# Patient Record
Sex: Female | Born: 2000 | Hispanic: No | Marital: Single | Smoking: Never smoker
Health system: Southern US, Community
[De-identification: ages and names within clinical notes are randomized; demographics above are authoritative.]

## PROBLEM LIST (undated history)

## (undated) DIAGNOSIS — G43909 Migraine, unspecified, not intractable, without status migrainosus: Secondary | ICD-10-CM

## (undated) DIAGNOSIS — J45909 Unspecified asthma, uncomplicated: Secondary | ICD-10-CM

## (undated) HISTORY — DX: Unspecified asthma, uncomplicated: J45.909

## (undated) HISTORY — DX: Migraine, unspecified, not intractable, without status migrainosus: G43.909

---
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
## 2021-08-20 ENCOUNTER — Emergency Department
Admission: EM | Admit: 2021-08-20 | Discharge: 2021-08-20 | Disposition: A | Discharge status: Home / Self Care | Payer: Medicaid - Out of State | Attending: Surgery | Admitting: Surgery

## 2021-08-20 DIAGNOSIS — Z20822 Contact with and (suspected) exposure to covid-19: Secondary | ICD-10-CM | POA: Insufficient documentation

## 2021-08-20 DIAGNOSIS — J020 Streptococcal pharyngitis: Secondary | ICD-10-CM | POA: Insufficient documentation

## 2021-08-20 LAB — VH STREP A RAPID TEST: Strep A, Rapid: POSITIVE — AB

## 2021-08-20 LAB — VH XPERT XPRESS © COV-2/FLU/RSV PLUS
Date of Onset: 20221128
Does patient reside in a congregate care setting?: NEGATIVE
Influenza A RNA: NEGATIVE
Influenza B RNA: NEGATIVE
Is patient employed in a healthcare setting?: NEGATIVE
RSV RNA: NEGATIVE
SARS-CoV-2 RNA: NEGATIVE

## 2021-08-20 MED ORDER — AMOXICILLIN-POT CLAVULANATE 875-125 MG PO TABS
1.00 | ORAL_TABLET | Freq: Two times a day (BID) | ORAL | 0 refills | Status: AC
Start: 2021-08-20 — End: 2021-08-30

## 2021-08-20 MED ORDER — AMOXICILLIN-POT CLAVULANATE 875-125 MG PO TABS
ORAL_TABLET | ORAL | Status: AC
Start: 2021-08-20 — End: ?
  Filled 2021-08-20: qty 1

## 2021-08-20 MED ORDER — AMOXICILLIN-POT CLAVULANATE 875-125 MG PO TABS
875.00 mg | ORAL_TABLET | Freq: Once | ORAL | Status: AC
Start: 2021-08-20 — End: 2021-08-20
  Administered 2021-08-20: 22:00:00 1 via ORAL

## 2021-08-20 NOTE — ED Triage Notes (Signed)
Sore throat for the last 2 days. Patient also c/o congested cough.

## 2021-08-20 NOTE — ED Provider Notes (Signed)
EMERGENCY DEPARTMENT HISTORY AND PHYSICAL EXAM      Date Time: 08/20/21 10:13 PM  Patient Name: Brandy Harper  Attending Physician: Arbie Cookey, DO      Assessment/Plan:   IMPRESSION/DIFFERENTAL DX:   Final diagnoses:   Strep throat     PLAN:   ED Disposition       ED Disposition   Discharge    Condition   --    Date/Time   Thu Aug 20, 2021 10:04 PM    Comment   Laverda Harper [place] discharge to home/self care.    Condition at disposition: Stable    Follow up with Brandy Harper, next week. Call for the appointment.                   History of Presenting Illness:   Brandy Harper IS A 20 y.o. female who presents with a several day hx of having URI/sore throat symptoms.  She thought it was "allergies" and didn't worry about it much.  She was worried about the "flu" so she wanted to get checked. She had covid and RSV previously.    Past Medical History:     Past Medical History:   Diagnosis Date    Asthma     Migraines        Past Surgical History:   History reviewed. No pertinent surgical history.    Family History:   History reviewed. No pertinent family history.    Social History:     Social History     Socioeconomic History    Marital status: Single     Spouse name: Not on file    Number of children: Not on file    Years of education: Not on file    Highest education level: Not on file   Occupational History    Not on file   Tobacco Use    Smoking status: Never    Smokeless tobacco: Never   Vaping Use    Vaping Use: Never used   Substance and Sexual Activity    Alcohol use: No    Drug use: No    Sexual activity: Not on file   Other Topics Concern    Not on file   Social History Narrative    Not on file     Social Determinants of Health     Financial Resource Strain: Not on file   Food Insecurity: Not on file   Transportation Needs: Not on file   Physical Activity: Not on file   Stress: Not on file   Social Connections: Not on file   Intimate Partner Violence: Not on file   Housing Stability:  Not on file       Allergies:     Allergies   Allergen Reactions    Latex Rash       Medications:     Previous Medications    ALBUTEROL (PROVENTIL HFA;VENTOLIN HFA) 108 (90 BASE) MCG/ACT INHALER    Inhale 2 puffs into the lungs every 4 (four) hours as needed        ALBUTEROL (PROVENTIL) (2.5 MG/3ML) 0.083% NEBULIZER SOLUTION    Take 2.5 mg by nebulization every 6 (six) hours as needed for Wheezing    DIPHENHYDRAMINE (BENADRYL) 25 MG TABLET    Take 1 tablet (25 mg total) by mouth every 6 (six) hours as needed for Itching or Allergies    FEXOFENADINE (ALLEGRA) 60 MG TABLET    Take 60 mg by mouth  2 (two) times daily    FLUTICASONE (FLOVENT HFA) 110 MCG/ACT INHALER    Inhale 1 puff into the lungs 2 (two) times daily        Review of Systems:   Constitutional: No fever or chills.  GI: No vomiting or diarrhea.  ENT: No ear pain.  + sore throat  Cardiovascular: No chest pain or palpitations.  Respiratory: No cough or shortness of breath.  Skin:No rash or skin lesions.  All other systems reviewed and negative except as above, pertinent findings in HPI.      Physical Exam:   Constitutional:  Vitals signs reviewed, well-appearing  Eyes:  Normal to inspection, no discharge  Neuro:  GCS 15, no focal motor deficits  Skin:  Warm, dry  Head:  Atraumatic, normocephalic  Resp/Chest:  Breath sounds normal, no distress  Cardio:  s1s2, rrr, no murmur,   Abdomen:  Soft, no peritioneal signs, non-tender  Upper Extremity:  Inspection normal, no cyanosis  Lower Extremity:  Inspection normal, no cyanosis  HEENT: + slightly red posterior pharynx, no exudates. No vesicles.     Labs / Rads:     Results       Procedure Component Value Units Date/Time    Respiratory Specimen Xpert Xpress  CoV-2 / Flu / RSV Plus [[id number]] Collected: 08/20/21 2018    Specimen: Nasopharyngeal Swab Updated: 08/20/21 2110     Influenza A RNA Negative     Influenza B RNA Negative     RSV RNA Negative     SARS-CoV-2 RNA Negative     Does patient have symptoms related  to condition of interest? Y     Date of Onset [id number]     Is patient employed in a healthcare setting? N     Does patient reside in a congregate care setting? N     Is the patient pregnant? UNK    Narrative:      Specimen source - Nasopharyngeal Swab     Influenza A tests are unable to distinguish between novel and seasonal influenza A.     A negative result for either Influenza A or B does not exclude influenza virus infection. Clinical correlation required.     All positive influenza tests (A or B) require placement of patient on droplet precaution isolation.    Strep A Rapid Test [[id number]]  (Abnormal) Collected: 08/20/21 2018    Specimen: Throat Updated: 08/20/21 2058     Strep A, Rapid **Positive**          No results found.      Labs and Radiological Studies Reviewed      Arbie Cookey, DO                           Brandy Harper, [place]  08/20/21 2213

## 2021-09-08 ENCOUNTER — Emergency Department
Admission: EM | Admit: 2021-09-08 | Discharge: 2021-09-08 | Disposition: A | Discharge status: Home / Self Care | Payer: Medicaid - Out of State | Attending: Emergency Medicine | Admitting: Emergency Medicine

## 2021-09-08 DIAGNOSIS — Z79899 Other long term (current) drug therapy: Secondary | ICD-10-CM | POA: Insufficient documentation

## 2021-09-08 DIAGNOSIS — R102 Pelvic and perineal pain: Secondary | ICD-10-CM | POA: Insufficient documentation

## 2021-09-08 DIAGNOSIS — N946 Dysmenorrhea, unspecified: Secondary | ICD-10-CM | POA: Insufficient documentation

## 2021-09-08 DIAGNOSIS — N939 Abnormal uterine and vaginal bleeding, unspecified: Secondary | ICD-10-CM | POA: Insufficient documentation

## 2021-09-08 LAB — COMPREHENSIVE METABOLIC PANEL
ALT: 13 U/L (ref 0–55)
AST (SGOT): 12 U/L (ref 10–42)
Albumin/Globulin Ratio: 1.34 Ratio (ref 0.80–2.00)
Albumin: 4.3 gm/dL (ref 3.5–5.0)
Alkaline Phosphatase: 50 U/L (ref 40–145)
Anion Gap: 13.5 mMol/L (ref 7.0–18.0)
BUN / Creatinine Ratio: 18.8 Ratio (ref 10.0–30.0)
BUN: 16 mg/dL (ref 7–22)
Bilirubin, Total: 1.5 mg/dL — ABNORMAL HIGH (ref 0.1–1.2)
CO2: 25 mMol/L (ref 20–30)
Calcium: 9.8 mg/dL (ref 8.5–10.5)
Chloride: 106 mMol/L (ref 98–110)
Creatinine: 0.85 mg/dL (ref 0.60–1.20)
EGFR: 101 mL/min/{1.73_m2} (ref 60–150)
Globulin: 3.2 gm/dL (ref 2.0–4.0)
Glucose: 100 mg/dL — ABNORMAL HIGH (ref 71–99)
Osmolality Calculated: 283 mOsm/kg (ref 275–300)
Potassium: 3.5 mMol/L (ref 3.5–5.3)
Protein, Total: 7.5 gm/dL (ref 6.0–8.3)
Sodium: 141 mMol/L (ref 136–147)

## 2021-09-08 LAB — CBC AND DIFFERENTIAL
Basophils %: 1.2 % (ref 0.0–3.0)
Basophils Absolute: 0.1 10*3/uL (ref 0.0–0.3)
Eosinophils %: 9.5 % — ABNORMAL HIGH (ref 0.0–7.0)
Eosinophils Absolute: 0.7 10*3/uL (ref 0.0–0.8)
Hematocrit: 44.7 % (ref 36.0–48.0)
Hemoglobin: 14.7 gm/dL (ref 12.0–16.0)
Lymphocytes Absolute: 2.6 10*3/uL (ref 0.6–5.1)
Lymphocytes: 33.9 % (ref 15.0–46.0)
MCH: 31 pg (ref 28–35)
MCHC: 33 gm/dL (ref 31–36)
MCV: 95 fL (ref 80–100)
MPV: 6.8 fL (ref 6.0–10.0)
Monocytes Absolute: 0.4 10*3/uL (ref 0.1–1.7)
Monocytes: 4.5 % (ref 3.0–15.0)
Neutrophils %: 50.8 % (ref 42.0–78.0)
Neutrophils Absolute: 4 10*3/uL (ref 1.7–8.6)
PLT CT: 237 10*3/uL (ref 130–440)
RBC: 4.7 10*6/uL (ref 3.80–5.00)
RDW: 11 % (ref 10.5–14.5)
WBC: 7.8 10*3/uL (ref 4.0–11.0)

## 2021-09-08 LAB — VH URINALYSIS WITH MICROSCOPIC AND CULTURE IF INDICATED
Bilirubin, UA: NEGATIVE mg/dL
Glucose, UA: NEGATIVE mg/dL
Ketones UA: NEGATIVE mg/dL
Nitrite, UA: NEGATIVE
Protein, UR: NEGATIVE mg/dL
Urine Specific Gravity: 1.03 (ref 1.001–1.040)
Urobilinogen, UA: 0.2 mg/dL — AB
pH, Urine: 5 pH (ref 5.0–8.0)

## 2021-09-08 LAB — URINE HCG QUALITATIVE: Urine HCG Qualitative: NEGATIVE

## 2021-09-08 MED ORDER — VH SODIUM CHLORIDE 0.9 % IV BOLUS
1000.00 mL | Freq: Once | INTRAVENOUS | Status: AC
Start: 2021-09-08 — End: 2021-09-08
  Administered 2021-09-08: 06:00:00 1000 mL via INTRAVENOUS

## 2021-09-08 MED ORDER — KETOROLAC TROMETHAMINE 30 MG/ML IJ SOLN
30.00 mg | Freq: Once | INTRAMUSCULAR | Status: AC
Start: 2021-09-08 — End: 2021-09-08
  Administered 2021-09-08: 06:00:00 30 mg via INTRAVENOUS

## 2021-09-08 MED ORDER — IBUPROFEN 600 MG PO TABS
600.0000 mg | ORAL_TABLET | Freq: Three times a day (TID) | ORAL | 0 refills | Status: AC | PRN
Start: 2021-09-08 — End: ?

## 2021-09-08 MED ORDER — KETOROLAC TROMETHAMINE 30 MG/ML IJ SOLN
INTRAMUSCULAR | Status: AC
Start: 2021-09-08 — End: ?
  Filled 2021-09-08: qty 1

## 2021-09-08 NOTE — ED Triage Notes (Signed)
Patient has severe menstrual pain that comes in waves.

## 2021-09-08 NOTE — Discharge Instructions (Signed)
Follow-up with your primary care doctor as well as with an OB/GYN.  Please return at anytime if you have any problems or concerns.

## 2021-09-08 NOTE — ED Provider Notes (Signed)
History     Chief Complaint   Patient presents with    Menstrual Problem     This is a 20 year old female, presents with chief complaint of lower pelvic pain, since the onset of her menses this morning.  She states she knew that she was going to beginning her menstrual cycle, and it is typically quite bad with severe cramping and heavy bleeding.  She has not discussed this with an OB/GYN but states that her mom and her aunt have similar symptoms when they were her age.  She has not taken anything prior to arrival but often when she does have her period she has to take 2 extra strength Tylenol's every 4-6 hours to an inch the pain.  She is not on any oral contraceptives, she has no concerns for sexually transmitted infections or pregnancy.    The history is provided by the patient.      Past Medical History:   Diagnosis Date    Asthma     Migraines        History reviewed. No pertinent surgical history.    History reviewed. No pertinent family history.    Social  Social History     Tobacco Use    Smoking status: Never    Smokeless tobacco: Never   Vaping Use    Vaping Use: Never used   Substance Use Topics    Alcohol use: No    Drug use: No       .     Allergies   Allergen Reactions    Latex Rash       Home Medications               albuterol (PROVENTIL HFA;VENTOLIN HFA) 108 (90 Base) MCG/ACT inhaler     Inhale 2 puffs into the lungs every 4 (four) hours as needed         albuterol (PROVENTIL) (2.5 MG/3ML) 0.083% nebulizer solution     Take 2.5 mg by nebulization every 6 (six) hours as needed for Wheezing     diphenhydrAMINE (BENADRYL) 25 MG tablet     Take 1 tablet (25 mg total) by mouth every 6 (six) hours as needed for Itching or Allergies     fexofenadine (ALLEGRA) 60 MG tablet     Take 60 mg by mouth 2 (two) times daily     fluticasone (FLOVENT HFA) 110 MCG/ACT inhaler     Inhale 1 puff into the lungs 2 (two) times daily             Review of Systems   Constitutional: Negative.    HENT: Negative.      Respiratory: Negative.     Cardiovascular: Negative.    Gastrointestinal:  Positive for abdominal pain. Negative for constipation, diarrhea, nausea and vomiting.   Endocrine: Negative.    Genitourinary:  Positive for menstrual problem, pelvic pain and vaginal bleeding. Negative for dysuria, flank pain, hematuria and vaginal discharge.   Musculoskeletal: Negative.    Skin: Negative.    Allergic/Immunologic: Negative.    Neurological: Negative.    Hematological: Negative.    Psychiatric/Behavioral: Negative.       Physical Exam    BP: 140/82, Heart Rate: 105, Temp: 97.7 F (36.5 C), Resp Rate: 20, SpO2: 100 %, Weight: 58.4 kg    Physical Exam  Vitals and nursing note reviewed.   Constitutional:       General: She is not in acute distress.     Appearance: Normal appearance.  She is well-developed and normal weight. She is not ill-appearing, toxic-appearing or diaphoretic.      Comments: Appears in pain, nontoxic.   HENT:      Head: Normocephalic and atraumatic.   Eyes:      General: No scleral icterus.     Conjunctiva/sclera: Conjunctivae normal.      Pupils: Pupils are equal, round, and reactive to light.   Neck:      Vascular: No JVD.      Trachea: Trachea normal.   Cardiovascular:      Rate and Rhythm: Normal rate and regular rhythm.      Pulses: Normal pulses.      Heart sounds: Normal heart sounds, S1 normal and S2 normal. No murmur heard.  Pulmonary:      Effort: Pulmonary effort is normal.      Breath sounds: Normal breath sounds. No stridor.   Abdominal:      General: Bowel sounds are normal.      Palpations: Abdomen is soft.      Tenderness: There is no abdominal tenderness.   Musculoskeletal:         General: Normal range of motion.      Cervical back: Full passive range of motion without pain, normal range of motion and neck supple.      Right lower leg: No edema.      Left lower leg: No edema.   Lymphadenopathy:      Cervical: No cervical adenopathy.   Skin:     General: Skin is warm and dry.       Capillary Refill: Capillary refill takes less than 2 seconds.      Comments: Undressed where appropriate   Neurological:      General: No focal deficit present.      Mental Status: She is alert and oriented to person, place, and time.      GCS: GCS eye subscore is 4. GCS verbal subscore is 5. GCS motor subscore is 6.      Comments: speech fluent, gait normal.   Psychiatric:         Mood and Affect: Mood normal.         Speech: Speech normal.         Behavior: Behavior normal.         MDM and ED Course     ED Medication Orders (From admission, onward)      Start Ordered     Status Ordering Provider    09/08/21 [id number] 09/08/21 0607  sodium chloride 0.9 % bolus 1,000 mL  Once in ED        Route: Intravenous  Ordered Dose: 1,000 mL     Last MAR action: Stopped Norvin Ohlin C    09/08/21 6213 09/08/21 0865  ketorolac (TORADOL) injection 30 mg  Once in ED        Route: Intravenous  Ordered Dose: 30 mg     Last MAR action: Given Janequa Kipnis C           Labs Reviewed   CBC AND DIFFERENTIAL - Abnormal; Notable for the following components:       Result Value    Eosinophils % 9.5 (*)     All other components within normal limits   COMPREHENSIVE METABOLIC PANEL - Abnormal; Notable for the following components:    Glucose 100 (*)     Bilirubin, Total 1.5 (*)     All other components within normal limits  VH URINALYSIS WITH MICROSCOPIC AND CULTURE IF INDICATED       - Abnormal; Notable for the following components:    Clarity, UA Cloudy (*)     Blood, UA Large (*)     Urobilinogen, UA 0.2 (*)     Leukocyte Esterase, UA Trace (*)     RBC, UA 20-30 (*)     Bacteria, UA Occasional (*)     All other components within normal limits    Narrative:     A Urine Culture has been ordered based upon the Positive UA results.   VH CULTURE, URINE   URINE HCG QUALITATIVE         MDM  Number of Diagnoses or Management Options  Dysmenorrhea  Diagnosis management comments: This is a 20 year old female with dysmenorrhea, has not take anything  prior to arrival, will start IV, check CBC and provide IV fluids and Toradol for pain control.       Amount and/or Complexity of Data Reviewed  Clinical lab tests: ordered and reviewed               652p she states she is completely pain-free now, we discussed supportive care as well as suppressive OCPs after she is seen by OB/GYN, or the use of ibuprofen before the onset of menses and hydration for supportive care.  Pt Frederic home with strict return precautions and the importance of follow up. She is discharged home in stable condition.      Procedures    Clinical Impression & Disposition     Clinical Impression  Final diagnoses:   Dysmenorrhea        ED Disposition       ED Disposition   Discharge    Condition   --    Date/Time   Tue Sep 08, 2021  6:59 AM    Comment   Kylene L Sirico discharge to home/self care.    Condition at disposition: Stable                  Discharge Medication List as of 09/08/2021  7:01 AM        START taking these medications    Details   ibuprofen (ADVIL) 600 MG tablet Take 1 tablet (600 mg) by mouth every 8 (eight) hours as needed for Pain Take with food, Starting Tue 09/08/2021, Print                         Judi Cong, MD  09/08/21 [phone]

## 2021-09-10 LAB — VH CULTURE, URINE

## 2021-12-06 ENCOUNTER — Emergency Department
Admission: EM | Admit: 2021-12-06 | Discharge: 2021-12-06 | Disposition: A | Discharge status: Home / Self Care | Payer: Medicaid HMO | Attending: Emergency Medicine | Admitting: Emergency Medicine

## 2021-12-06 DIAGNOSIS — R5383 Other fatigue: Secondary | ICD-10-CM | POA: Insufficient documentation

## 2021-12-06 DIAGNOSIS — Z8709 Personal history of other diseases of the respiratory system: Secondary | ICD-10-CM | POA: Insufficient documentation

## 2021-12-06 DIAGNOSIS — J069 Acute upper respiratory infection, unspecified: Secondary | ICD-10-CM | POA: Insufficient documentation

## 2021-12-06 DIAGNOSIS — R0602 Shortness of breath: Secondary | ICD-10-CM | POA: Insufficient documentation

## 2021-12-06 DIAGNOSIS — J029 Acute pharyngitis, unspecified: Secondary | ICD-10-CM | POA: Insufficient documentation

## 2021-12-06 DIAGNOSIS — Z20822 Contact with and (suspected) exposure to covid-19: Secondary | ICD-10-CM | POA: Insufficient documentation

## 2021-12-06 DIAGNOSIS — R509 Fever, unspecified: Secondary | ICD-10-CM | POA: Insufficient documentation

## 2021-12-06 DIAGNOSIS — R059 Cough, unspecified: Secondary | ICD-10-CM | POA: Insufficient documentation

## 2021-12-06 LAB — VH XPERT XPRESS © COV-2/FLU/RSV PLUS
Date of Onset: 20230312
Does patient reside in a congregate care setting?: NEGATIVE
Influenza A RNA: NEGATIVE
Influenza B RNA: NEGATIVE
Is patient employed in a healthcare setting?: NEGATIVE
RSV RNA: NEGATIVE
SARS-CoV-2 RNA: NEGATIVE

## 2021-12-06 MED ORDER — DEXAMETHASONE 4 MG PO TABS
8.0000 mg | ORAL_TABLET | Freq: Once | ORAL | Status: AC
Start: 2021-12-06 — End: 2021-12-06
  Administered 2021-12-06: 8 mg via ORAL

## 2021-12-06 MED ORDER — DEXAMETHASONE 4 MG PO TABS
ORAL_TABLET | ORAL | Status: AC
Start: 2021-12-06 — End: ?
  Filled 2021-12-06: qty 2

## 2021-12-06 NOTE — Discharge Instructions (Signed)
COVID flu and RSV tests are pending.  We will call you with any positive results.  You can also review them on MyChart.  Drink plenty of fluids.  Use your asthma medications as needed.  You were given a dose of steroids here that is long-acting.  Take Tylenol as needed for fevers or pain.

## 2021-12-06 NOTE — ED Provider Notes (Signed)
EMERGENCY DEPARTMENT  History and Physical Exam       Patient Name: Brandy Harper,Brandy Harper  Encounter Date:  12/06/2021  Attending Physician: Rosalyn GessMichael E. Grace IsaacWatts, M.D.  PCP: Oneita HurtPcp, None, MD  Patient DOB:  2001/07/05  MRN:  [id number]  Room:  EX8/EX8-A    Medical Decision Making     History exam most consistent with viral syndrome.  Patient looks well.  She has a fairly normal exam.  Does have a hoarse voice.  Sounds like she lost her voice about a week ago and has been getting slowly better.  She has a history of asthma and was worried she had a respiratory infection.    Lungs are clear.  Oropharynx is moist.  Again she does have a hoarse voice.  Abdomen is benign.  No cervical adenopathy.  No meningismus.  She does not have any wheezing.    I think this is viral.  I am going to swab her.  We will give her some steroids.  Her history of asthma she likely does have a little bit of a flareup even though she is not wheezing now.    Respiratory panel is negative.  Patient is feeling much better after drinking crackers.  She is comfortable going home.  She was given Decadron here.    In addition to the above history, please see nursing notes. Allergies, meds, past medical, family, social hx, and the results of the diagnostic studies performed have been reviewed by myself.         Diagnosis / Disposition     Clinical Impression  1. Acute URI    2. History of asthma        Disposition  ED Disposition       ED Disposition   Discharge    Condition   --    Date/Time   Sun Dec 06, 2021  3:20 PM    Comment   [name] discharge to home/self care.    Condition at disposition: Stable                   Follow up for Discharged Patients  No follow-up provider specified.    Prescriptions for Discharged Patients  New Prescriptions    No medications on file                   History of Presenting Illness     Chief complaint: Cough, Sore Throat, and Shortness of Breath    HPI/ROS is limited by: none  HPI/ROS given by: Patient    Brandy  Harper Harper is a 21 y.o. female who presents to the ED with complaint of 1 week of respiratory symptoms that include subjective fevers laryngitis loss of voice now hoarse voice no shortness of breath she has had some myalgias.  She was wheezing earlier and used her inhalers.  She is had a little bit of a cough.  She did feel short of breath.  Her inhalers help.  She felt earlier that she was presyncopal but did not pass out.  She has been drinking fluids.  Her mom encouraged her to come to the hospital.       Review of Systems   Review of Systems   Constitutional:  Positive for chills, fatigue and fever (Subjective).   HENT:  Positive for congestion. Negative for sore throat.    Respiratory:  Positive for cough, shortness of breath and wheezing.    Cardiovascular:  Negative for chest pain and  palpitations.   Gastrointestinal:  Negative for abdominal pain, diarrhea and vomiting.   Genitourinary: Negative.    Musculoskeletal:  Positive for myalgias.   Skin:  Negative for rash.        Other pertinent ROS findings in HPI.  Allergies & Medications     Pt is allergic to bee venom and latex.    Current/Home Medications    ALBUTEROL (PROVENTIL HFA;VENTOLIN HFA) 108 (90 BASE) MCG/ACT INHALER    Inhale 2 puffs into the lungs every 4 (four) hours as needed        ALBUTEROL (PROVENTIL) (2.5 MG/3ML) 0.083% NEBULIZER SOLUTION    Take 2.5 mg by nebulization every 6 (six) hours as needed for Wheezing    DIPHENHYDRAMINE (BENADRYL) 25 MG TABLET    Take 1 tablet (25 mg total) by mouth every 6 (six) hours as needed for Itching or Allergies    FEXOFENADINE (ALLEGRA) 60 MG TABLET    Take 60 mg by mouth 2 (two) times daily    FLUTICASONE (FLOVENT HFA) 110 MCG/ACT INHALER    Inhale 1 puff into the lungs 2 (two) times daily    IBUPROFEN (ADVIL) 600 MG TABLET    Take 1 tablet (600 mg) by mouth every 8 (eight) hours as needed for Pain Take with food        Past Medical History     Pt   Past Medical History:   Diagnosis Date    Asthma      Migraines         Past Surgical History     Pt History reviewed. No pertinent surgical history.     Family History     The family history is not on file.     Social History     Pt reports that she has never smoked. She has never used smokeless tobacco. She reports that she does not drink alcohol and does not use drugs.     Physical Exam     Blood pressure 130/78, pulse 112, temperature 99.8 F (37.7 C), temperature source Tympanic, resp. rate 18, height 1.702 m, weight 60.2 kg, last menstrual period 11/16/2021, SpO2 98 %.    Vitals signs reviewed.    Physical Exam  Vitals and nursing note reviewed.   Constitutional:       General: She is not in acute distress.     Appearance: She is well-developed.   HENT:      Head: Normocephalic.      Mouth/Throat:      Mouth: Mucous membranes are moist.      Pharynx: Uvula midline. No oropharyngeal exudate.      Comments: Voice is a little hoarse.  No stridor.  Eyes:      Extraocular Movements: Extraocular movements intact.      Conjunctiva/sclera: Conjunctivae normal.   Cardiovascular:      Rate and Rhythm: Normal rate and regular rhythm.      Pulses: Normal pulses.      Heart sounds: Normal heart sounds.   Pulmonary:      Effort: Pulmonary effort is normal.      Breath sounds: Normal breath sounds.   Abdominal:      Palpations: Abdomen is soft.      Tenderness: There is no abdominal tenderness.   Musculoskeletal:      Cervical back: Normal range of motion and neck supple.   Skin:     General: Skin is warm.   Neurological:      Mental Status:  She is alert and oriented to person, place, and time.   Psychiatric:         Behavior: Behavior normal.              Diagnostic Results     The results of the diagnostic studies have been reviewed by myself:    Radiologic Studies  No results found.         Consultation          Procedures     None        Note:  This chart was generated by the Epic EMR system/speech recognition and may contain inherent errors or omissions not intended by  the user. Grammatical errors, random word insertions, deletions, pronoun errors and incomplete sentences are occasional consequences of this technology due to software limitations. Not all errors are caught or corrected. If there are questions or concerns about the content of this note or information contained within the body of this dictation they should be addressed directly with the author for clarification        Tori Milks, MD  12/06/21 1540

## 2021-12-06 NOTE — ED Triage Notes (Addendum)
Voice hoarseness and sore throat for about a week. Reports that she felt that she had a fever today but did not check at home, states that she did take tylenol at 1230. Reports presyncopal episode at home., no LOC. Also feeling SOB, hx of asthma. Reports taking neb at home and it helped with SOB, but did not take completely away.

## 2022-04-06 ENCOUNTER — Emergency Department
Admission: EM | Admit: 2022-04-06 | Discharge: 2022-04-06 | Disposition: A | Discharge status: Home / Self Care | Payer: Medicaid HMO | Attending: Emergency Medicine | Admitting: Emergency Medicine

## 2022-04-06 DIAGNOSIS — W458XXA Other foreign body or object entering through skin, initial encounter: Secondary | ICD-10-CM | POA: Insufficient documentation

## 2022-04-06 DIAGNOSIS — S91332A Puncture wound without foreign body, left foot, initial encounter: Secondary | ICD-10-CM | POA: Insufficient documentation

## 2022-04-06 MED ORDER — CIPROFLOXACIN HCL 500 MG PO TABS
500.0000 mg | ORAL_TABLET | Freq: Two times a day (BID) | ORAL | 0 refills | Status: AC
Start: 2022-04-06 — End: 2022-04-13

## 2022-04-06 MED ORDER — IBUPROFEN 600 MG PO TABS
600.0000 mg | ORAL_TABLET | Freq: Four times a day (QID) | ORAL | 0 refills | Status: AC | PRN
Start: 2022-04-06 — End: ?

## 2022-04-06 NOTE — ED Provider Notes (Signed)
[facility]  Emergency Department       Patient Name: Brandy Harper, Brandy Harper Patient DOB:  Jan 23, 2001   Encounter Date:  04/06/2022 Age: 21 y.o. female   Attending ED Physician: Dorie Rank, MD MRN:  [id number]   Room:  EX2/EX2-A PCP: Pcp, None, MD      Diagnosis / Disposition:   Final Impression  1. Puncture wound of left foot, initial encounter        Disposition              ED Disposition       ED Disposition   Discharge    Condition   --    Date/Time   Tue Apr 06, 2022 12:44 PM    Comment   Reggie L Lemus discharge to home/self care.    Condition at disposition: Stable                 Follow up  Petra Kuba, DPM  [address]  [place] [address]  [phone]    Schedule an appointment as soon as possible for a visit in 2 days  For wound re-check      Prescriptions  New Prescriptions    CIPROFLOXACIN (CIPRO) 500 MG TABLET    Take 1 tablet (500 mg) by mouth 2 (two) times daily for 7 days    IBUPROFEN (ADVIL) 600 MG TABLET    Take 1 tablet (600 mg) by mouth every 6 (six) hours as needed for Pain or Fever         History of Presenting Illness:   Chief complaint: Puncture Wound    HPI/ROS is limited by: none  HPI/ROS given by: Patient    Location: L foot  Quality: Achy  Duration: 2 days  Severity: mild    Nursing Notes reviewed and acknowledged.    Brandy Harper is a 21 y.o. female who presents with single puncture wound to plantar aspect of L foot after stepping on a nail in a pig [place]. States she was wearing Crocs. Injury happened 2 days ago. States L foot was swollen but now improved. Able to bear weight and ambulate. No bleeding or drainage. Tetanus UTD. Denies other injuries or complaints.    In addition to the above history, please see nursing notes. Allergies, meds, past medical, family, social hx, and the results of the diagnostic studies performed have been reviewed by myself.      Review of Systems   HEENT:  No ear pain. No congestion.  No discharge. No sore throat.  No  difficulty swallowing.  Respiratory: No cough.  No shortness of breath.  Cardiovascular: No chest pain.  No palpitations.  GI: No Nausea, Vomiting, Diarrhea, or GI bleeding  GU:  No dysuria. No hematuria.   Neurological:  No headache.  No lateralizing weakness.  Musculoskeletal:  No unusual myalgias or weakness  Skin:  No rash.  +single puncture wound to plantar aspect of L foot  Endocrine:  No weight change, polyuria, or polydypsia  Psychiatric:  No depression.  No anxiety.  No SI/HI/AVH    All other systems reviewed and negative except as above, pertinent findings in HPI.        Allergies / Medications:   Pt is allergic to bee venom and latex.    Current/Home Medications    ALBUTEROL (PROVENTIL HFA;VENTOLIN HFA) 108 (90 BASE) MCG/ACT INHALER    Inhale 2 puffs into the lungs every 4 (four) hours as needed  ALBUTEROL (PROVENTIL) (2.5 MG/3ML) 0.083% NEBULIZER SOLUTION    Take 2.5 mg by nebulization every 6 (six) hours as needed for Wheezing    DIPHENHYDRAMINE (BENADRYL) 25 MG TABLET    Take 1 tablet (25 mg total) by mouth every 6 (six) hours as needed for Itching or Allergies    FEXOFENADINE (ALLEGRA) 60 MG TABLET    Take 60 mg by mouth 2 (two) times daily    FLUTICASONE (FLOVENT HFA) 110 MCG/ACT INHALER    Inhale 1 puff into the lungs 2 (two) times daily    IBUPROFEN (ADVIL) 600 MG TABLET    Take 1 tablet (600 mg) by mouth every 8 (eight) hours as needed for Pain Take with food         Past History:   Medical: Pt has a past medical history of Asthma and Migraines.    Surgical: Pt  has no past surgical history on file.    Family: The family history is not on file.    Social: Pt reports that she has never smoked. She has never used smokeless tobacco. She reports that she does not drink alcohol and does not use drugs.      Physical Exam:   Constitutional: Vital signs reviewed. Well appearing.  Head: Normocephalic, atraumatic  Eyes: PERRL. EOMI. Conjunctiva and sclera are normal.  No injection or discharge.  Ears,  Nose, Throat:  Normal external examination of the nose and ears.    Neck: Normal range of motion. Non-tender.   Respiratory/Chest: No respiratory distress.  Cardiac: regular rate and rhythm  Extremities: +single puncture wound to plantar aspect of L foot w/o surrounding erythema or induration or fluctuance. no edema, positive pulses with good capillary refill  Neurological: No focal motor or sensory deficits by observation. Speech normal. Normal gait.  Skin: Warm and dry. No rash.  Psychiatric: Alert and conversant.  Normal affect.  Normal insight.        MDM:   21 y.o. female presents with single puncture wound to plantar aspect of L foot after stepping on a nail in a pig pen. Tetanus UTD. No surrounding evidence of cellulitis or abscess. NV intact. Pt is ambulatory. No bony injuries or foreign body appreciated and pt declined XR. C/f puncture wound in L foot. Will [place] w rx for ciprofloxacin or pseudomonal coverage, ibuprofen and [place] w podiatry f/u for wound check.       Course in ED:         Diagnostic Results:   The results of the diagnostic studies have been reviewed by myself:    Radiologic Studies  No results found.    Lab Studies  Labs Reviewed - No data to display    EKG: none          Procedure / Critical Care time/EKG:   Total time providing critical care:none      ATTESTATIONS   This chart was generated by an EMR and may contain errors or additions/omissions not intended by the user.           Dorie Rank, M.D.                 Joellyn Quails, MD  04/06/22 1255

## 2022-04-06 NOTE — ED Triage Notes (Signed)
Patient c/o left foot pain and swelling after stepping on a nail in a pig pen.

## 2022-04-06 NOTE — Discharge Instructions (Addendum)
Your physical exam was notable for a single puncture wound of the sole of left foot.  There is no clinical evidence of infection at this time.  Your tetanus immunization is up-to-date.  Take ciprofloxacin as prescribed to prevent soft tissue infection.  Take ibuprofen or Tylenol as needed for pain.  Keep your wound clean and dry and wash with soap and warm water daily.  Follow-up with a podiatrist or primary care physician within 2 days for wound check.  Return to ER if you develop any concerning symptoms.

## 2022-04-23 ENCOUNTER — Emergency Department
Admission: EM | Admit: 2022-04-23 | Discharge: 2022-04-23 | Disposition: A | Discharge status: Home / Self Care | Payer: Medicaid HMO | Attending: Emergency Medicine | Admitting: Emergency Medicine

## 2022-04-23 DIAGNOSIS — R0602 Shortness of breath: Secondary | ICD-10-CM | POA: Insufficient documentation

## 2022-04-23 DIAGNOSIS — R42 Dizziness and giddiness: Secondary | ICD-10-CM | POA: Insufficient documentation

## 2022-04-23 DIAGNOSIS — R519 Headache, unspecified: Secondary | ICD-10-CM | POA: Insufficient documentation

## 2022-04-23 DIAGNOSIS — R110 Nausea: Secondary | ICD-10-CM | POA: Insufficient documentation

## 2022-04-23 DIAGNOSIS — F430 Acute stress reaction: Secondary | ICD-10-CM | POA: Insufficient documentation

## 2022-04-23 MED ORDER — ONDANSETRON 4 MG PO TBDP
ORAL_TABLET | ORAL | Status: AC
Start: 2022-04-23 — End: ?
  Filled 2022-04-23: qty 1

## 2022-04-23 MED ORDER — ALBUTEROL SULFATE (2.5 MG/3ML) 0.083% IN NEBU
2.5000 mg | INHALATION_SOLUTION | RESPIRATORY_TRACT | 0 refills | Status: AC | PRN
Start: 2022-04-23 — End: ?

## 2022-04-23 MED ORDER — ALBUTEROL SULFATE (2.5 MG/3ML) 0.083% IN NEBU
INHALATION_SOLUTION | RESPIRATORY_TRACT | Status: AC
Start: 2022-04-23 — End: ?
  Filled 2022-04-23: qty 3

## 2022-04-23 MED ORDER — ALBUTEROL SULFATE (2.5 MG/3ML) 0.083% IN NEBU
2.5000 mg | INHALATION_SOLUTION | Freq: Once | RESPIRATORY_TRACT | Status: AC
Start: 2022-04-23 — End: 2022-04-23
  Administered 2022-04-23: 2.5 mg via RESPIRATORY_TRACT

## 2022-04-23 MED ORDER — ONDANSETRON 4 MG PO TBDP
4.0000 mg | ORAL_TABLET | Freq: Once | ORAL | Status: AC
Start: 2022-04-23 — End: 2022-04-23
  Administered 2022-04-23: 4 mg via ORAL

## 2022-04-23 NOTE — ED Provider Notes (Signed)
EMERGENCY DEPARTMENT  History and Physical Exam       Patient Name: Brandy Harper, Brandy Harper  Encounter Date:  04/23/2022  Attending Physician: Rosalyn Gess. Grace Isaac, M.D.  PCP: Oneita Hurt, None, MD  Patient DOB:  14-Jun-2001  MRN:  [id number]  Room:  EX7/EX7-A    Medical Decision Making     Patient says she think she is having an asthma attack.  She woke up earlier and had a little bit of a headache and felt dizzy.  Her dizziness is better now.  She is out of her albuterol nebs.  She does have an albuterol inhaler with a spacer but does not feel like that works as well.    On exam her vital signs are normal.  She is talking in full sentences.  She has no wheezing.  I got her up and walked her.  She had no balance problems.    She asked if this could be related to stress.    I will get her a neb to go.  She is not wheezing now.  There is no respiratory distress.  Her sats are good.  I do not think she needs steroids.  She would prefer not to do steroids.    I do not think imaging or labs are indicated.  She feels better just talking to me.  She wanted to take a nap at 1 point.  No objective findings on exam and get a letter go I am sending her home with 1 albuterol neb for her machine and I will write her for the same.  Give her dose of Zofran here as well.    In addition to the above history, please see nursing notes. Allergies, meds, past medical, family, social hx, and the results of the diagnostic studies performed have been reviewed by myself.         Diagnosis / Disposition     Clinical Impression  1. Dizziness    2. Stress reaction        Disposition  ED Disposition       ED Disposition   Discharge    Condition   --    Date/Time   Fri Apr 23, 2022  1:49 AM    Comment   Alanta L Kawecki discharge to home/self care.    Condition at disposition: Stable                   Follow up for Discharged Patients  [facility]  [address]  [place] [address]  [phone]  Go to   If symptoms  worsen      Prescriptions for Discharged Patients  New Prescriptions    ALBUTEROL (PROVENTIL) (2.5 MG/3ML) 0.083% NEBULIZER SOLUTION    Take 3 mLs (2.5 mg) by nebulization every 4 (four) hours as needed for Wheezing or Shortness of Breath                   History of Presenting Illness     Chief complaint: No chief complaint on file.    HPI/ROS is limited by: none  HPI/ROS given by: Patient    Brandy Harper is a 21 y.o. female who presents to the ED with complaint of feeling like she has had an asthma attack for the last 3 days.  She does not describe wheezing.  She is talking in full sentences.  She says she is out of her albuterol nebulizer solution but does have her inhaler.  She  does have a spacer.  She feels like that does not work as well.  She woke up this evening and also had some dizziness and a little bit of a headache and a little bit of nausea.  She feels like she might have a little viral thing going on.  Family member drove her in.  She walked back to the room without difficulty.  LMP 10 days ago.     Review of Systems   Review of Systems   Constitutional:  Negative for chills and fever.   Respiratory:  Positive for shortness of breath. Negative for cough.    Cardiovascular:  Negative for chest pain.   Gastrointestinal:  Positive for nausea. Negative for abdominal pain.   Neurological:  Positive for dizziness.        Other pertinent ROS findings in HPI.  Allergies & Medications     Pt is allergic to bee venom and latex.    Current/Home Medications    ALBUTEROL (PROVENTIL HFA;VENTOLIN HFA) 108 (90 BASE) MCG/ACT INHALER    Inhale 2 puffs into the lungs every 4 (four) hours as needed        DIPHENHYDRAMINE (BENADRYL) 25 MG TABLET    Take 1 tablet (25 mg total) by mouth every 6 (six) hours as needed for Itching or Allergies    FEXOFENADINE (ALLEGRA) 60 MG TABLET    Take 60 mg by mouth 2 (two) times daily    FLUTICASONE (FLOVENT HFA) 110 MCG/ACT INHALER    Inhale 1 puff into the lungs 2 (two) times  daily    IBUPROFEN (ADVIL) 600 MG TABLET    Take 1 tablet (600 mg) by mouth every 8 (eight) hours as needed for Pain Take with food    IBUPROFEN (ADVIL) 600 MG TABLET    Take 1 tablet (600 mg) by mouth every 6 (six) hours as needed for Pain or Fever        Past Medical History     Pt   Past Medical History:   Diagnosis Date    Asthma     Migraines         Past Surgical History     Pt History reviewed. No pertinent surgical history.     Family History     The family history is not on file.     Social History     Pt reports that she has never smoked. She has never used smokeless tobacco. She reports that she does not drink alcohol and does not use drugs.     Physical Exam     Blood pressure 104/68, pulse (!) 58, temperature 98.1 F (36.7 C), temperature source Tympanic, resp. rate 16, height 1.702 m, weight 58 kg, last menstrual period 03/18/2022, SpO2 100 %.    Vitals signs reviewed.    Physical Exam  Vitals and nursing note reviewed.   Constitutional:       General: She is not in acute distress.     Appearance: She is well-developed.   HENT:      Head: Normocephalic.   Eyes:      Extraocular Movements: Extraocular movements intact.      Conjunctiva/sclera: Conjunctivae normal.   Cardiovascular:      Rate and Rhythm: Normal rate and regular rhythm.      Pulses: Normal pulses.   Pulmonary:      Effort: Pulmonary effort is normal.      Breath sounds: Normal breath sounds. No wheezing.   Musculoskeletal:  Cervical back: Neck supple.   Skin:     General: Skin is warm.   Neurological:      General: No focal deficit present.      Mental Status: She is alert and oriented to person, place, and time.      Gait: Gait normal.   Psychiatric:         Behavior: Behavior normal.              Diagnostic Results     The results of the diagnostic studies have been reviewed by myself:    Radiologic Studies  No results found.         Consultation          Procedures     None        Note:  This chart was generated by the [employer] EMR  system/speech recognition and may contain inherent errors or omissions not intended by the user. Grammatical errors, random word insertions, deletions, pronoun errors and incomplete sentences are occasional consequences of this technology due to software limitations. Not all errors are caught or corrected. If there are questions or concerns about the content of this note or information contained within the body of this dictation they should be addressed directly with the author for clarification        Tori Milks, MD  04/23/22 [phone]

## 2022-04-23 NOTE — ED Triage Notes (Signed)
Patient stating she is having an asthma attack for three days and could not find her albuterol for her nebulizer, Patient also stating she is dizzy, patient also stating she is nauseated .

## 2022-04-23 NOTE — ED Notes (Signed)
Patient ambulatory to room with steady gait and no respiratory distress noted, 100% on room air respirations even and unlabored. Patient placed in stretcher lights dimmed patient stated "I am going to try and take a nap".
